# Patient Record
Sex: Female | Born: 1965
Health system: Southern US, Community
[De-identification: ages and names within clinical notes are randomized; demographics above are authoritative.]

## PROBLEM LIST (undated history)

## (undated) DIAGNOSIS — I10 Essential (primary) hypertension: Secondary | ICD-10-CM

## (undated) DIAGNOSIS — F419 Anxiety disorder, unspecified: Secondary | ICD-10-CM

## (undated) HISTORY — PX: TONSILLECTOMY: SUR1361

## (undated) HISTORY — PX: APPENDECTOMY: SHX54

## (undated) HISTORY — PX: TONSILLECTOMY: SHX5217

---

## 2008-09-11 ENCOUNTER — Ambulatory Visit: Payer: Self-pay | Admitting: Family Medicine

## 2008-09-11 DIAGNOSIS — IMO0002 Reserved for concepts with insufficient information to code with codable children: Secondary | ICD-10-CM

## 2008-09-11 DIAGNOSIS — M79609 Pain in unspecified limb: Secondary | ICD-10-CM

## 2008-09-11 DIAGNOSIS — S62319A Displaced fracture of base of unspecified metacarpal bone, initial encounter for closed fracture: Secondary | ICD-10-CM

## 2008-10-03 ENCOUNTER — Encounter: Admission: RE | Admit: 2008-10-03 | Discharge: 2008-10-03 | Payer: Self-pay | Admitting: Orthopedic Surgery

## 2010-11-25 ENCOUNTER — Encounter: Payer: Self-pay | Admitting: Internal Medicine

## 2011-04-27 ENCOUNTER — Emergency Department: Admission: EM | Admit: 2011-04-27 | Discharge: 2011-04-27 | Payer: BC Managed Care – PPO | Source: Home / Self Care

## 2011-05-12 IMAGING — CR DG HAND 2V*L*
2 series · 2 of 2 positions shown · non-contrast
Comparison: Left hand radiographs 09/11/2008

CLINICAL DATA: Metacarpal fracture of the left hand.  Patient fell.
Two views requested per ordering physician.

LEFT HAND - 2 VIEW

[view not recorded (1 of 2)]
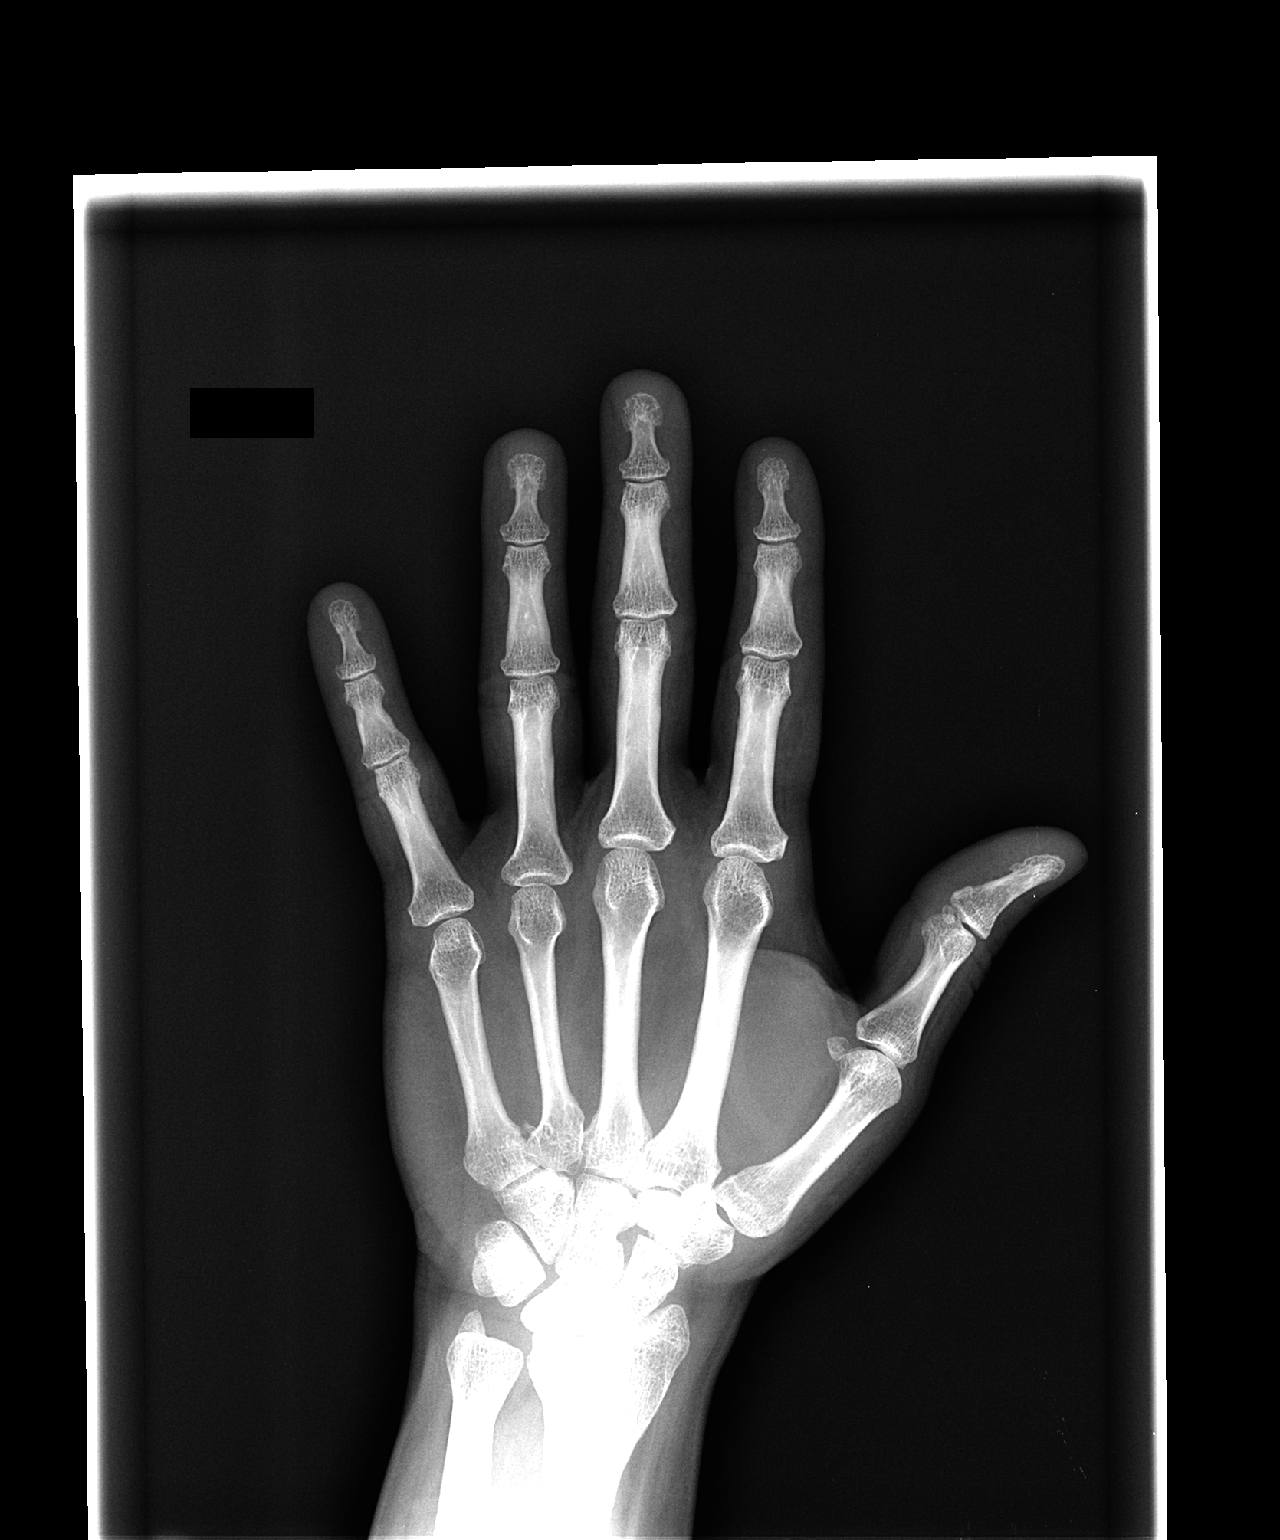

[view not recorded (2 of 2)]
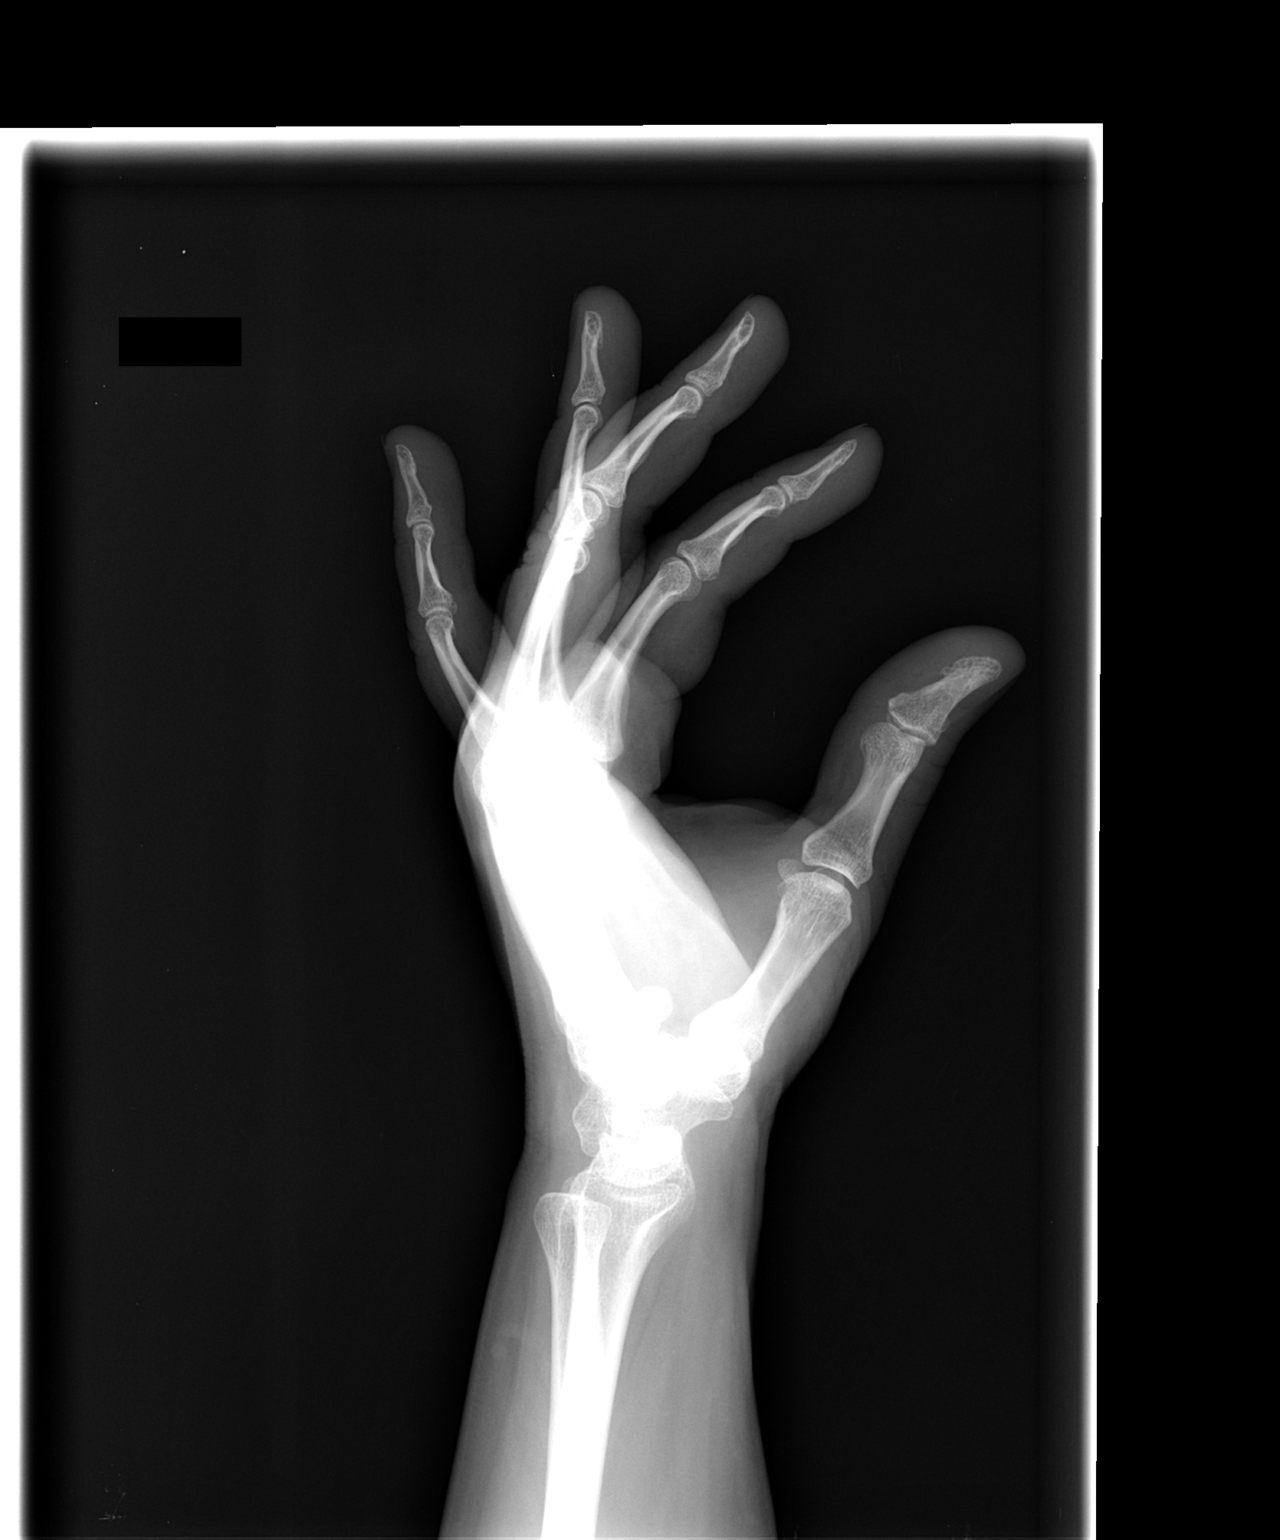

[2 of 2 positions shown; findings below may reference images not displayed]

FINDINGS: The joints are aligned.  No subluxation or dislocation.

A small bony fragment positioned between the base of the fourth and
fifth metacarpals appears similar compared to 09/11/2008.  Donor
site is suspected to be from the radial side of the base of the
fifth metacarpal.  This fragment is approximately 1-2 mm separate
from the base of the fifth metacarpal.  No focal soft tissue
swelling is appreciated.
IMPRESSION: Small bony fragment adjacent to the base of the fifth metacarpal,
likely a small fracture fragment, is unchanged compared to
09/11/2008.

## 2011-11-09 ENCOUNTER — Emergency Department (INDEPENDENT_AMBULATORY_CARE_PROVIDER_SITE_OTHER)
Admission: EM | Admit: 2011-11-09 | Discharge: 2011-11-09 | Disposition: A | Discharge status: Home / Self Care | Payer: BC Managed Care – PPO | Source: Home / Self Care | Attending: Family Medicine | Admitting: Family Medicine

## 2011-11-09 ENCOUNTER — Encounter: Payer: Self-pay | Admitting: *Deleted

## 2011-11-09 DIAGNOSIS — H6120 Impacted cerumen, unspecified ear: Secondary | ICD-10-CM

## 2011-11-09 DIAGNOSIS — J069 Acute upper respiratory infection, unspecified: Secondary | ICD-10-CM

## 2011-11-09 DIAGNOSIS — H6122 Impacted cerumen, left ear: Secondary | ICD-10-CM

## 2011-11-09 HISTORY — DX: Anxiety disorder, unspecified: F41.9

## 2011-11-09 MED ORDER — BENZONATATE 200 MG PO CAPS: 200.0000 mg | ORAL_CAPSULE | Freq: Every day | ORAL | Status: AC

## 2011-11-09 MED ORDER — AMOXICILLIN 875 MG PO TABS: 875.0000 mg | ORAL_TABLET | Freq: Two times a day (BID) | ORAL | Status: AC

## 2011-11-09 NOTE — ED Provider Notes (Signed)
History     CSN: [id number]  Arrival date & time 11/09/11  1336   First MD Initiated Contact with Patient 11/09/11 1408      Chief Complaint  Patient presents with  . Cough  . Nasal Congestion      HPI Comments: Patient complains of approximately 5 day history of gradually progressive URI symptoms beginning with a mild sore throat (now improved), followed by progressive nasal congestion.  A cough started about 3 days ago.  Complains of fatigue but no myalgias.  Cough is now worse at night and generally productive during the day.  There has been no pleuritic pain, shortness of breath, or wheezes.   The history is provided by the patient.    Past Medical History  Diagnosis Date  . Anxiety     Past Surgical History  Procedure Date  . Appendectomy   . Tonsillectomy     History reviewed. No pertinent family history.  History  Substance Use Topics  . Smoking status: Never Smoker   . Smokeless tobacco: Not on file  . Alcohol Use: No    OB History    Grav Para Term Preterm Abortions TAB SAB Ect Mult Living                  Review of Systems + sore throat, resolved + cough No pleuritic pain No wheezing + nasal congestion + post-nasal drainage No sinus pain/pressure No itchy/red eyes ? earache No hemoptysis No SOB No fever?chills No nausea No vomiting No abdominal pain No diarrhea No urinary symptoms No skin rashes + fatigue No myalgias + headache Used OTC meds without relief  Allergies  Review of patient's allergies indicates no known allergies.  Home Medications   Current Outpatient Rx  Name Route Sig Dispense Refill  . AMOXICILLIN 875 MG PO TABS Oral Take 1 tablet (875 mg total) by mouth 2 (two) times daily. (Rx void after 11/17/11) 20 tablet 0  . BENZONATATE 200 MG PO CAPS Oral Take 1 capsule (200 mg total) by mouth at bedtime. Take as needed for cough 12 capsule 0  . CITALOPRAM HYDROBROMIDE 20 MG PO TABS Oral Take 20 mg by mouth daily.         BP 141/93  Pulse 116  Temp 98.8 F (37.1 C) (Oral)  Resp 16  Ht 5\' 7"  (1.702 m)  Wt 223 lb (101.152 kg)  BMI 34.93 kg/m2  SpO2 99%  LMP 11/08/2011  Physical Exam Nursing notes and Vital Signs reviewed. Appearance:  Patient appears stated age, and in no acute distress.  Patient is obese (BMI 35)   Eyes:  Pupils are equal, round, and reactive to light and accomodation.  Extraocular movement is intact.  Conjunctivae are not inflamed  Ears:  Right canal and tympanic membrane normal.  Left TM occluded with cerumen  Nose:  Mildly congested turbinates.  No sinus tenderness.   Pharynx:  Normal Neck:  Supple.   Tender shotty posterior nodes are palpated bilaterally  Lungs:  Clear to auscultation.  Breath sounds are equal.  Heart:  Regular rate and rhythm without murmurs, rubs, or gallops.  Abdomen:  Nontender without masses or hepatosplenomegaly.  Bowel sounds are present.  No CVA or flank tenderness.  Extremities:  No edema.  No calf tenderness Skin:  No rash present.   ED Course  Procedures  Left ear lavage:  Post lavage, the canal and tympanic membrane appear normal.      1. Acute upper respiratory infections of  unspecified site   2. Impacted cerumen of left ear       MDM  There is no evidence of bacterial infection today.   Treat symptomatically for now  Prescription written for Benzonatate (Tessalon) to take at bedtime for night-time cough.  Take Mucinex D (guaifenesin with decongestant) twice daily for congestion.  Increase fluid intake, rest. May use Afrin nasal spray (or generic oxymetazoline) twice daily for about 5 days.  Also recommend using saline nasal spray several times daily and saline nasal irrigation (AYR is a common brand) Stop all antihistamines for now, and other non-prescription cough/cold preparations. Begin Amoxicillin if not improving about 5 days or if persistent fever develops (Given a prescription to hold, with an expiration date)  Follow-up with  family doctor if not improving 7 to 10 days.         Lattie Haw, MD 11/09/11 [phone]

## 2011-11-09 NOTE — ED Notes (Signed)
Patient c/o productive cough, congestion and sneezing x 3 days. She has taken Mucinex OTC.

## 2011-11-09 NOTE — Discharge Instructions (Signed)
Take Mucinex D (guaifenesin with decongestant) twice daily for congestion.  Increase fluid intake, rest. May use Afrin nasal spray (or generic oxymetazoline) twice daily for about 5 days.  Also recommend using saline nasal spray several times daily and saline nasal irrigation (AYR is a common brand) Stop all antihistamines for now, and other non-prescription cough/cold preparations. Begin Amoxicillin if not improving about 5 days or if persistent fever develops. Follow-up with family doctor if not improving 7 to 10 days.

## 2013-04-22 ENCOUNTER — Encounter: Payer: Self-pay | Admitting: Emergency Medicine

## 2013-04-22 ENCOUNTER — Emergency Department (INDEPENDENT_AMBULATORY_CARE_PROVIDER_SITE_OTHER)
Admission: EM | Admit: 2013-04-22 | Discharge: 2013-04-22 | Disposition: A | Discharge status: Home / Self Care | Payer: BC Managed Care – PPO | Source: Home / Self Care | Attending: Family Medicine | Admitting: Family Medicine

## 2013-04-22 DIAGNOSIS — H8110 Benign paroxysmal vertigo, unspecified ear: Secondary | ICD-10-CM

## 2013-04-22 MED ORDER — PREDNISONE 20 MG PO TABS: 20.0000 mg | ORAL_TABLET | Freq: Two times a day (BID) | ORAL | Status: DC

## 2013-04-22 NOTE — ED Provider Notes (Signed)
CSN: [id number]     Arrival date & time 04/22/13  1229 History   First MD Initiated Contact with Patient 04/22/13 1257     Chief Complaint  Patient presents with  . Dizziness      HPI Comments: Patient developed dizziness (sensation of movement and being off-balance) five days ago.  Over the past two days she has been fatigued and had chills.  She had a similar condition last June that resolved after several days and improved with Antivert 25mg .  She has had nausea and several episodes of vomiting but is able to take fluids.  Her husband has developed a URI.  The history is provided by the patient and the spouse.    Past Medical History  Diagnosis Date  . Anxiety    Past Surgical History  Procedure Laterality Date  . Appendectomy    . Tonsillectomy     No family history on file. History  Substance Use Topics  . Smoking status: Never Smoker   . Smokeless tobacco: Not on file  . Alcohol Use: No   OB History   Grav Para Term Preterm Abortions TAB SAB Ect Mult Living                 Review of Systems No sore throat + dizziness No cough No pleuritic pain No wheezing No nasal congestion No post-nasal drainage No sinus pain/pressure No itchy/red eyes No earache No hemoptysis No SOB No fever, + chills + nausea + occasional vomiting No abdominal pain No diarrhea No urinary symptoms No skin rash + fatigue + myalgias No headache Used OTC meds without relief  Allergies  Review of patient's allergies indicates no known allergies.  Home Medications   Current Outpatient Rx  Name  Route  Sig  Dispense  Refill  . LORazepam (ATIVAN) 1 MG tablet   Oral   Take 1 mg by mouth every 8 (eight) hours.         . meclizine (ANTIVERT) 25 MG tablet   Oral   Take 25 mg by mouth 3 (three) times daily as needed for dizziness.         . ondansetron (ZOFRAN-ODT) 4 MG disintegrating tablet   Oral   Take 4 mg by mouth every 8 (eight) hours as needed for nausea or vomiting.         . citalopram (CELEXA) 20 MG tablet   Oral   Take 20 mg by mouth daily.           . predniSONE (DELTASONE) 20 MG tablet   Oral   Take 1 tablet (20 mg total) by mouth 2 (two) times daily.   10 tablet   0    BP 113/79  Pulse 75  Temp(Src) 98 F (36.7 C)  Ht 5\' 6"  (1.676 m)  Wt 161 lb (73.029 kg)  BMI 26.00 kg/m2  SpO2 98%  LMP 02/28/2013 Physical Exam Nursing notes and Vital Signs reviewed. Appearance:  Patient appears healthy, stated age, and in no acute distress but appears uncomfortable with any movement Eyes:  Pupils are equal, round, and reactive to light and accomodation.  Extraocular movement is intact.  Conjunctivae are not inflamed.  Fundi benign.  No photophobia.  Slight nystagmus on lateral gaze.  Ears:  Canals normal.  Tympanic membranes normal.  Nose:  Mildly congested turbinates.  No sinus tenderness.   Pharynx:  Normal Neck:  Supple.   Tender shotty posterior nodes are palpated bilaterally.  Carotids have normal upstrokes without  bruits  Lungs:  Clear to auscultation.  Breath sounds are equal.  Heart:  Regular rate and rhythm without murmurs, rubs, or gallops.  Abdomen:  Nontender without masses or hepatosplenomegaly.  Bowel sounds are present.  No CVA or flank tenderness.  Extremities:  No edema.  No calf tenderness Skin:  No rash present. Neurologic:  Cranial nerves 2 through 12 are normal.  Patellar, achilles, and elbow reflexes are normal.  Cerebellar function is intact (finger-to-nose and rapid alternating hand movement).  Gait and station are normal.      ED Course  Procedures  none         MDM   1. Benign paroxysmal positional vertigo; suspect early viral URI     Begin prednisone burst. May continue meclizine and lorazepam.  If cold symptoms develop: Take Mucinex D (1200mg  guaifenesin with decongestant) twice daily for congestion.  Increase fluid intake, rest. May use Afrin nasal spray (or generic oxymetazoline) twice daily for about 5  days.  Also recommend using saline nasal spray several times daily and saline nasal irrigation (AYR is a common brand) Try warm salt water gargles for sore throat.  Stop all antihistamines for now, and other non-prescription cough/cold preparations. Followup with ENT if not improving one week.       Lattie Haw, MD 04/25/13 1311

## 2013-04-22 NOTE — ED Notes (Signed)
Vertigo x 6 days

## 2015-10-21 ENCOUNTER — Emergency Department
Admission: EM | Admit: 2015-10-21 | Discharge: 2015-10-21 | Disposition: A | Discharge status: Home / Self Care | Payer: BLUE CROSS/BLUE SHIELD | Source: Home / Self Care | Attending: Family Medicine | Admitting: Family Medicine

## 2015-10-21 ENCOUNTER — Encounter: Payer: Self-pay | Admitting: *Deleted

## 2015-10-21 DIAGNOSIS — H9201 Otalgia, right ear: Secondary | ICD-10-CM

## 2015-10-21 MED ORDER — PREDNISONE 20 MG PO TABS: ORAL_TABLET | ORAL | Status: DC

## 2015-10-21 MED ORDER — FLUTICASONE PROPIONATE 50 MCG/ACT NA SUSP: 2.0000 | Freq: Every day | NASAL | Status: DC

## 2015-10-21 NOTE — Discharge Instructions (Signed)

## 2015-10-21 NOTE — ED Notes (Signed)
Pt c/o right ear pain that moves into her jaw at times with right sided sinus congestion x 3-4 days. No drainage. IBF used @ home.

## 2015-10-21 NOTE — ED Provider Notes (Signed)
CSN: [id number]     Arrival date & time 10/21/15  1210 History   First MD Initiated Contact with Patient 10/21/15 1225     Chief Complaint  Patient presents with  . Otalgia   (Consider location/radiation/quality/duration/timing/severity/associated sxs/prior Treatment) Patient is a 50 y.o. female presenting with ear pain. The history is provided by the patient.  Otalgia Location:  Right Behind ear:  No abnormality Quality:  Aching, pressure and sore Severity:  Moderate Onset quality:  Gradual Duration:  4 days Timing:  Constant Progression:  Waxing and waning Chronicity:  New Context: not direct blow, not elevation change, not foreign body in ear, not loud noise and no water in ear   Relieved by:  OTC medications (mild relief) Worsened by:  Coughing Associated symptoms: congestion, cough ( minimal) and rhinorrhea   Associated symptoms: no abdominal pain, no diarrhea, no ear discharge, no fever, no headaches, no hearing loss, no sore throat, no tinnitus and no vomiting       Past Medical History  Diagnosis Date  . Anxiety    Past Surgical History  Procedure Laterality Date  . Appendectomy    . Tonsillectomy     History reviewed. No pertinent family history. Social History  Substance Use Topics  . Smoking status: Never Smoker   . Smokeless tobacco: None  . Alcohol Use: No   OB History    No data available     Review of Systems  Constitutional: Negative for fever, chills and appetite change.  HENT: Positive for congestion, ear pain and rhinorrhea. Negative for ear discharge, hearing loss, sore throat and tinnitus.   Respiratory: Positive for cough ( minimal).   Gastrointestinal: Negative for nausea, vomiting, abdominal pain and diarrhea.  Neurological: Negative for dizziness, light-headedness and headaches.    Allergies  Review of patient's allergies indicates no known allergies.  Home Medications   Prior to Admission medications   Medication Sig Start Date End  Date Taking? Authorizing Provider  citalopram (CELEXA) 20 MG tablet Take 20 mg by mouth daily.      Historical Provider, MD  fluticasone (FLONASE) 50 MCG/ACT nasal spray Place 2 sprays into both nostrils daily. For at least 2 weeks, then daily as needed seasonally. 10/21/15   Junius FinnerErin O'Malley, PA-C  predniSONE (DELTASONE) 20 MG tablet 3 tabs po day one, then 2 po daily x 4 days 10/21/15   Junius FinnerErin O'Malley, PA-C   Meds Ordered and Administered this Visit  Medications - No data to display  BP 128/86 mmHg  Pulse 75  Temp(Src) 98.6 F (37 C) (Oral)  Resp 16  Wt 225 lb (102.059 kg)  SpO2 97%  LMP 10/20/2015 No data found.   Physical Exam  Constitutional: She is oriented to person, place, and time. She appears well-developed and well-nourished.  HENT:  Head: Normocephalic and atraumatic.  Right Ear: Tympanic membrane is injected. Tympanic membrane is not bulging.  Left Ear: Tympanic membrane normal.  Nose: Nose normal. Right sinus exhibits no maxillary sinus tenderness and no frontal sinus tenderness. Left sinus exhibits no maxillary sinus tenderness and no frontal sinus tenderness.  Mouth/Throat: Uvula is midline, oropharynx is clear and moist and mucous membranes are normal.  Eyes: EOM are normal.  Neck: Normal range of motion. Neck supple.  Cardiovascular: Normal rate, regular rhythm and normal heart sounds.   Pulmonary/Chest: Effort normal and breath sounds normal. No stridor. No respiratory distress. She has no wheezes. She has no rales.  Musculoskeletal: Normal range of motion.  Lymphadenopathy:  She has no cervical adenopathy.  Neurological: She is alert and oriented to person, place, and time.  Skin: Skin is warm and dry.  Psychiatric: She has a normal mood and affect. Her behavior is normal.  Nursing note and vitals reviewed.   ED Course  Procedures (including critical care time)  Labs Review Labs Reviewed - No data to display  Imaging Review No results  found.  Tympanometry: Normal in Both ears.  MDM   1. Otalgia, right    Pt c/o Right otalgia for 3-4 days c/w mild congestion.  Tympanometry: normal. No evidence of bacterial infection at this time.  Rx: Prednisone and Flonase  Advised pt to use acetaminophen and ibuprofen as needed for fever and pain. Encouraged rest and fluids. F/u with PCP in 7-10 days if not improving, sooner if worsening. Pt verbalized understanding and agreement with tx plan.     Junius Finner, PA-C 10/21/15 1255

## 2017-07-12 ENCOUNTER — Emergency Department (INDEPENDENT_AMBULATORY_CARE_PROVIDER_SITE_OTHER)
Admission: EM | Admit: 2017-07-12 | Discharge: 2017-07-12 | Disposition: A | Discharge status: Home / Self Care | Payer: BLUE CROSS/BLUE SHIELD | Source: Home / Self Care | Attending: Emergency Medicine | Admitting: Emergency Medicine

## 2017-07-12 ENCOUNTER — Encounter: Payer: Self-pay | Admitting: *Deleted

## 2017-07-12 ENCOUNTER — Other Ambulatory Visit: Payer: Self-pay

## 2017-07-12 DIAGNOSIS — J209 Acute bronchitis, unspecified: Secondary | ICD-10-CM | POA: Diagnosis not present

## 2017-07-12 MED ORDER — AZITHROMYCIN 250 MG PO TABS: ORAL_TABLET | ORAL | 0 refills | Status: AC

## 2017-07-12 MED ORDER — PREDNISONE 50 MG PO TABS: 50.0000 mg | ORAL_TABLET | Freq: Every day | ORAL | 0 refills | Status: DC

## 2017-07-12 NOTE — ED Triage Notes (Signed)
Pt c/o nasal congestion, cough and HA x 6 days. Denies fever. She has taken Tylenol and Mucinex.

## 2017-07-12 NOTE — ED Provider Notes (Signed)
[facility]    CSN: [id number] Arrival date & time: 07/12/17  1222     History   Chief Complaint Chief Complaint  Patient presents with  . Cough    HPI Tina Salazar is a 52 y.o. female.   HPI URI HISTORY  Tina Salazar is a 52 y.o. female who complains of onset of cold symptoms for 7 days.   Have been using over-the-counter treatment which helps a little bit.  No chills/sweats +  Fever  +  Nasal congestion +  Discolored Post-nasal drainage No sinus pain/pressure No sore throat  + Hacking cough No wheezing Positive chest congestion No hemoptysis No shortness of breath No pleuritic pain  No itchy/red eyes No earache  No nausea No vomiting No abdominal pain No diarrhea  No skin rashes +  Fatigue No myalgias No headache   Past Medical History:  Diagnosis Date  . Anxiety     Patient Active Problem List   Diagnosis Date Noted  . HAND PAIN, LEFT 09/11/2008  . CLOSED FRACTURE OF BASE OF OTHER METACARPAL BONE 09/11/2008  . ABRASION, FACE 09/11/2008    Past Surgical History:  Procedure Laterality Date  . APPENDECTOMY    . TONSILLECTOMY    . TONSILLECTOMY      OB History    No data available       Home Medications    Prior to Admission medications   Medication Sig Start Date End Date Taking? Authorizing Provider  citalopram (CELEXA) 20 MG tablet Take 20 mg by mouth daily.     Yes [provider]  azithromycin (ZITHROMAX Z-PAK) 250 MG tablet Take 2 tablets on day one, then 1 tablet daily on days 2 through 5 07/12/17   Lajean Manes, MD  predniSONE (DELTASONE) 50 MG tablet Take 1 tablet (50 mg total) by mouth daily. With food for 5 days. 07/12/17   Lajean Manes, MD    Family History History reviewed. No pertinent family history.  Social History Social History   Tobacco Use  . Smoking status: Never Smoker  . Smokeless tobacco: Never Used  Substance Use Topics  . Alcohol use: No  . Drug use: No     Allergies     Patient has no known allergies.   Review of Systems Review of Systems  All other systems reviewed and are negative.    Physical Exam Triage Vital Signs ED Triage Vitals  Enc Vitals Group     BP 07/12/17 1301 (!) 133/91     Pulse Rate 07/12/17 1301 94     Resp 07/12/17 1301 18     Temp 07/12/17 1301 98.3 F (36.8 C)     Temp Source 07/12/17 1301 Oral     SpO2 07/12/17 1301 97 %     Weight 07/12/17 1302 240 lb (108.9 kg)     Height 07/12/17 1302 5\' 7"  (1.702 m)     Head Circumference --      Peak Flow --      Pain Score 07/12/17 1302 0     Pain Loc --      Pain Edu? --      Excl. in GC? --    No data found.  Updated Vital Signs BP (!) 133/91 (BP Location: Right Arm)   Pulse 94   Temp 98.3 F (36.8 C) (Oral)   Resp 18   Ht 5\' 7"  (1.702 m)   Wt 240 lb (108.9 kg)   LMP 05/11/2017   SpO2 97%  BMI 37.59 kg/m   Visual Acuity Right Eye Distance:   Left Eye Distance:   Bilateral Distance:    Right Eye Near:   Left Eye Near:    Bilateral Near:     Physical Exam  Constitutional: She is oriented to person, place, and time. She appears well-developed and well-nourished. No distress.  HENT:  Head: Normocephalic and atraumatic.  Right Ear: Tympanic membrane normal.  Left Ear: Tympanic membrane normal.  Nose: Nose normal.  Mouth/Throat: Oropharynx is clear and moist. No oropharyngeal exudate.  Eyes: Right eye exhibits no discharge. Left eye exhibits no discharge. No scleral icterus.  Neck: Neck supple.  Cardiovascular: Normal rate, regular rhythm and normal heart sounds.  Pulmonary/Chest: No respiratory distress. She has wheezes (Mild, late expiratory). She has rhonchi. She has no rales.  Lymphadenopathy:    She has no cervical adenopathy.  Neurological: She is alert and oriented to person, place, and time.  Skin: Skin is warm and dry.  Nursing note and vitals reviewed.    UC Treatments / Results  Labs (all labs ordered are listed, but only abnormal  results are displayed) Labs Reviewed - No data to display  EKG  EKG Interpretation None       Radiology No results found.  Procedures Procedures (including critical care time)  Medications Ordered in UC Medications - No data to display   Initial Impression / Assessment and Plan / UC Course  I have reviewed the triage vital signs and the nursing notes.  Pertinent labs & imaging results that were available during my care of the patient were reviewed by me and considered in my medical decision making (see chart for details).       Final Clinical Impressions(s) / UC Diagnoses   Final diagnoses:  Acute bronchitis with bronchospasm  Treatment options discussed, as well as risks, benefits, alternatives. She declined nebulizer treatment here in urgent care. Patient voiced understanding and agreement with the following plans: Use albuterol MDI that she has at home from prior bronchitis in the past, which helped.  ED Discharge Orders        Ordered    azithromycin (ZITHROMAX Z-PAK) 250 MG tablet     07/12/17 1319    predniSONE (DELTASONE) 50 MG tablet  Daily     07/12/17 1319     Written and verbal information given. Follow-up with your primary care doctor in 5-7 days if not improving, or sooner if symptoms become worse. Precautions discussed. Red flags discussed. Questions invited and answered. Patient voiced understanding and agreement.   Controlled Substance Prescriptions [place] Controlled Substance Registry consulted? Not Applicable   Lajean ManesMassey, David, MD 07/12/17 2120

## 2017-09-30 ENCOUNTER — Encounter: Payer: Self-pay | Admitting: Emergency Medicine

## 2017-09-30 ENCOUNTER — Telehealth: Payer: Self-pay

## 2017-09-30 ENCOUNTER — Emergency Department (INDEPENDENT_AMBULATORY_CARE_PROVIDER_SITE_OTHER)
Admission: EM | Admit: 2017-09-30 | Discharge: 2017-09-30 | Disposition: A | Discharge status: Home / Self Care | Payer: BLUE CROSS/BLUE SHIELD | Source: Home / Self Care | Attending: Family Medicine | Admitting: Family Medicine

## 2017-09-30 DIAGNOSIS — R51 Headache: Secondary | ICD-10-CM | POA: Diagnosis not present

## 2017-09-30 DIAGNOSIS — R519 Headache, unspecified: Secondary | ICD-10-CM

## 2017-09-30 DIAGNOSIS — I10 Essential (primary) hypertension: Secondary | ICD-10-CM | POA: Diagnosis not present

## 2017-09-30 HISTORY — DX: Essential (primary) hypertension: I10

## 2017-09-30 MED ORDER — METOCLOPRAMIDE HCL 5 MG/ML IJ SOLN
5.0000 mg | Freq: Once | INTRAMUSCULAR | Status: AC
  Administered 2017-09-30: 5 mg via INTRAMUSCULAR

## 2017-09-30 MED ORDER — ONDANSETRON HCL 4 MG PO TABS: 4.0000 mg | ORAL_TABLET | Freq: Four times a day (QID) | ORAL | 0 refills | Status: AC

## 2017-09-30 MED ORDER — KETOROLAC TROMETHAMINE 60 MG/2ML IM SOLN
60.0000 mg | Freq: Once | INTRAMUSCULAR | Status: AC
  Administered 2017-09-30: 60 mg via INTRAMUSCULAR

## 2017-09-30 MED ORDER — LISINOPRIL 20 MG PO TABS: 20.0000 mg | ORAL_TABLET | Freq: Every day | ORAL | 0 refills | Status: AC

## 2017-09-30 MED ORDER — DEXAMETHASONE SODIUM PHOSPHATE 10 MG/ML IJ SOLN
10.0000 mg | Freq: Once | INTRAMUSCULAR | Status: AC
  Administered 2017-09-30: 10 mg via INTRAMUSCULAR

## 2017-09-30 NOTE — ED Notes (Signed)
Medications given IM without problems patient tolerated well , lights dimmed, husband at bedside.

## 2017-09-30 NOTE — ED Triage Notes (Signed)
Patient presents to [facility] with C/O headache 3 to 4 days, worse today rates pain 9/10. C/O nausea without vomiting. Patient also states her last few doctors appointments she has had HTN.

## 2017-09-30 NOTE — ED Provider Notes (Signed)
[facility]    CSN: [id number] Arrival date & time: 09/30/17  1147     History   Chief Complaint Chief Complaint  Patient presents with  . Headache    HPI Tina Salazar is a 52 y.o. female.   HPI Tina Salazar is a 52 y.o. female presenting to UC with c/o gradually worsening HA for the last 3-4 days.  Pain is worse today, 9/10.  HA is generalized, aching and throbbing. Nausea but no vomiting. Mild photophobia.  Hx of migraines about 5 years ago.  She does report being told her BP has been high the last few doctors visits [facility].  She has not been officially dx with HTN.  She is not on BP medication.     Past Medical History:  Diagnosis Date  . Anxiety   . Hypertension    Last few visits to PCP    Patient Active Problem List   Diagnosis Date Noted  . HAND PAIN, LEFT 09/11/2008  . CLOSED FRACTURE OF BASE OF OTHER METACARPAL BONE 09/11/2008  . ABRASION, FACE 09/11/2008    Past Surgical History:  Procedure Laterality Date  . APPENDECTOMY    . TONSILLECTOMY    . TONSILLECTOMY      OB History   None      Home Medications    Prior to Admission medications   Medication Sig Start Date End Date Taking? Authorizing Provider  mometasone [facility]) 220 MCG/INH inhaler Inhale 2 puffs into the lungs daily.   Yes [provider]  pramipexole (MIRAPEX) 0.25 MG tablet Take 0.25 mg by mouth 3 (three) times daily.   Yes [provider]  azithromycin (ZITHROMAX Z-PAK) 250 MG tablet Take 2 tablets on day one, then 1 tablet daily on days 2 through 5 07/12/17   Lajean Manes, MD  citalopram (CELEXA) 20 MG tablet Take 20 mg by mouth daily.      [provider]  lisinopril (PRINIVIL,ZESTRIL) 20 MG tablet Take 1 tablet (20 mg total) by mouth daily. 09/30/17   Lurene Shadow, PA-C  ondansetron (ZOFRAN) 4 MG tablet Take 1 tablet (4 mg total) by mouth every 6 (six) hours. 09/30/17   Lurene Shadow, PA-C    Family History History  reviewed. No pertinent family history.  Social History Social History   Tobacco Use  . Smoking status: Never Smoker  . Smokeless tobacco: Never Used  Substance Use Topics  . Alcohol use: No  . Drug use: No     Allergies   Patient has no known allergies.   Review of Systems Review of Systems  Eyes: Positive for photophobia. Negative for pain and visual disturbance.  Gastrointestinal: Positive for nausea. Negative for diarrhea and vomiting.  Musculoskeletal: Negative for myalgias, neck pain and neck stiffness.  Skin: Negative for rash.  Neurological: Positive for headaches. Negative for dizziness, syncope, facial asymmetry, speech difficulty, weakness, light-headedness and numbness.     Physical Exam Triage Vital Signs ED Triage Vitals  Enc Vitals Group     BP 09/30/17 1214 (!) 180/102     Pulse Rate 09/30/17 1214 89     Resp 09/30/17 1214 16     Temp 09/30/17 1214 98.5 F (36.9 C)     Temp Source 09/30/17 1214 Oral     SpO2 09/30/17 1214 98 %     Weight 09/30/17 1216 245 lb 8 oz (111.4 kg)     Height 09/30/17 1216  (1.702 m)  Head Circumference --      Peak Flow --      Pain Score 09/30/17 1215 9     Pain Loc --      Pain Edu? --      Excl. in GC? --    No data found.  Updated Vital Signs BP (!) 176/110 (BP Location: Right Arm)   Pulse 89   Temp 98.5 F (36.9 C) (Oral)   Resp 16   Ht  (1.702 m)   Wt 245 lb 8 oz (111.4 kg)   SpO2 98%   BMI 38.45 kg/m   Visual Acuity Right Eye Distance:   Left Eye Distance:   Bilateral Distance:    Right Eye Near:   Left Eye Near:    Bilateral Near:     Physical Exam  Constitutional: She is oriented to person, place, and time. She appears well-developed and well-nourished.  Non-toxic appearance. She does not appear ill. No distress.  HENT:  Head: Normocephalic and atraumatic.  Right Ear: Tympanic membrane normal.  Left Ear: Tympanic membrane normal.  Nose: Nose normal. Right sinus exhibits no  maxillary sinus tenderness and no frontal sinus tenderness. Left sinus exhibits no maxillary sinus tenderness and no frontal sinus tenderness.  Mouth/Throat: Uvula is midline, oropharynx is clear and moist and mucous membranes are normal.  Eyes: Pupils are equal, round, and reactive to light. EOM are normal.  Neck: Normal range of motion. Neck supple.  Cardiovascular: Normal rate and regular rhythm.  Pulmonary/Chest: Effort normal and breath sounds normal. No respiratory distress.  Musculoskeletal: Normal range of motion.  Neurological: She is alert and oriented to person, place, and time. She has normal strength. She displays a negative Romberg sign. Coordination and gait normal. GCS eye subscore is 4. GCS verbal subscore is 5. GCS motor subscore is 6.  Skin: Skin is warm and dry. Capillary refill takes less than 2 seconds.  Psychiatric: She has a normal mood and affect. Her behavior is normal.  Nursing note and vitals reviewed.    UC Treatments / Results  Labs (all labs ordered are listed, but only abnormal results are displayed) Labs Reviewed - No data to display  EKG None  Radiology No results found.  Procedures Procedures (including critical care time)  Medications Ordered in UC Medications  ketorolac (TORADOL) injection 60 mg (60 mg Intramuscular Given 09/30/17 1235)  dexamethasone (DECADRON) injection 10 mg (10 mg Intramuscular Given 09/30/17 1236)  metoCLOPramide (REGLAN) injection 5 mg (5 mg Intramuscular Given 09/30/17 1236)    Initial Impression / Assessment and Plan / UC Course  I have reviewed the triage vital signs and the nursing notes.  Pertinent labs & imaging results that were available during my care of the patient were reviewed by me and considered in my medical decision making (see chart for details).     Hx and exam most c/w migraine.  HA improved from 9/10 to 6/10 after migraine cocktail. Normal neuro exam. BP- slight decreased systolic pressure but  increased diastolic pressure.  Will start pt on Lisinopril today.  Pt feels comfortable being discharged home.  Encouraged to take the blood pressure medication today as soon as she gets it.  Will call pt later today to check on her. Home care instructions provided below.    Final Clinical Impressions(s) / UC Diagnoses   Final diagnoses:  Bad headache  Essential hypertension     Discharge Instructions      Try to rest in a cool dark  room today to help with your headache.  Be sure to stay well hydrated with at least eight 8oz glasses of water per day and at least 8 hours of sleep at night.    Your blood pressure and headache could be stress related.  Try to limit stress as much as possible.   Pain can cause your blood pressure to increased and elevated blood pressure can cause your headache.  It is important to monitor your blood pressure and to follow up with your family doctor this week to discuss the new blood pressure medication- lisinopril, you were started on today for hypertension.    Do not hesitate to call 911 or go to the hospital if your head pain worsens, you develop change in vision, weakness or numbness in your arms or legs, unable to keep down fluids, or any other new concerning symptoms develop.    ED Prescriptions    Medication Sig Dispense Auth. Provider   ondansetron (ZOFRAN) 4 MG tablet Take 1 tablet (4 mg total) by mouth every 6 (six) hours. 12 tablet Doroteo Glassman, Tamula Morrical O, PA-C   lisinopril (PRINIVIL,ZESTRIL) 20 MG tablet Take 1 tablet (20 mg total) by mouth daily. 30 tablet Lurene Shadow, PA-C     Controlled Substance Prescriptions [place] Controlled Substance Registry consulted? Not Applicable   Rolla Plate 09/30/17 1322

## 2017-09-30 NOTE — Telephone Encounter (Signed)
Spoke to pt. She had just woken from a nap. Said her headache was bearable but not completely gone. Advised pt to stay hydrated, rest and take ibuprofen or tylenol migraine to see if that would help completely clear it up.

## 2017-09-30 NOTE — ED Notes (Signed)
Patient discharged with husband. States headache is easing off.

## 2017-09-30 NOTE — Discharge Instructions (Addendum)
°  Try to rest in a cool dark room today to help with your headache.  Be sure to stay well hydrated with at least eight 8oz glasses of water per day and at least 8 hours of sleep at night.    Your blood pressure and headache could be stress related.  Try to limit stress as much as possible.   Pain can cause your blood pressure to increased and elevated blood pressure can cause your headache.  It is important to monitor your blood pressure and to follow up with your family doctor this week to discuss the new blood pressure medication- lisinopril, you were started on today for hypertension.    Do not hesitate to call 911 or go to the hospital if your head pain worsens, you develop change in vision, weakness or numbness in your arms or legs, unable to keep down fluids, or any other new concerning symptoms develop.
